# Patient Record
Sex: Male | Born: 1995 | Race: Black or African American | Hispanic: No | Marital: Single | Smoking: Current every day smoker
Health system: Southern US, Community
[De-identification: ages and names within clinical notes are randomized; demographics above are authoritative.]

---
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
## 2018-07-11 ENCOUNTER — Other Ambulatory Visit: Payer: Self-pay

## 2018-07-11 ENCOUNTER — Encounter: Payer: Self-pay | Admitting: Emergency Medicine

## 2018-07-11 ENCOUNTER — Emergency Department
Admission: EM | Admit: 2018-07-11 | Discharge: 2018-07-11 | Disposition: A | Discharge status: Home / Self Care | Payer: Self-pay | Attending: Emergency Medicine | Admitting: Emergency Medicine

## 2018-07-11 DIAGNOSIS — N3000 Acute cystitis without hematuria: Secondary | ICD-10-CM | POA: Insufficient documentation

## 2018-07-11 DIAGNOSIS — F1210 Cannabis abuse, uncomplicated: Secondary | ICD-10-CM | POA: Insufficient documentation

## 2018-07-11 DIAGNOSIS — R109 Unspecified abdominal pain: Secondary | ICD-10-CM | POA: Insufficient documentation

## 2018-07-11 DIAGNOSIS — R1012 Left upper quadrant pain: Secondary | ICD-10-CM | POA: Insufficient documentation

## 2018-07-11 LAB — COMPREHENSIVE METABOLIC PANEL
ALT: 60 U/L — AB (ref 0–44)
AST: 31 U/L (ref 15–41)
Albumin: 4.4 g/dL (ref 3.5–5.0)
Alkaline Phosphatase: 84 U/L (ref 38–126)
Anion gap: 6 (ref 5–15)
BILIRUBIN TOTAL: 0.6 mg/dL (ref 0.3–1.2)
BUN: 11 mg/dL (ref 6–20)
CO2: 26 mmol/L (ref 22–32)
CREATININE: 1 mg/dL (ref 0.61–1.24)
Calcium: 9.2 mg/dL (ref 8.9–10.3)
Chloride: 106 mmol/L (ref 98–111)
GFR calc Af Amer: >60 mL/min (ref >60)
GFR calc non Af Amer: >60 mL/min (ref >60)
GLUCOSE: 93 mg/dL (ref 70–99)
Potassium: 4.1 mmol/L (ref 3.5–5.1)
Sodium: 138 mmol/L (ref 135–145)
TOTAL PROTEIN: 7.3 g/dL (ref 6.5–8.1)

## 2018-07-11 LAB — CBC
HEMATOCRIT: 47.3 % (ref 39.0–52.0)
Hemoglobin: 16.2 g/dL (ref 13.0–17.0)
MCH: 31.8 pg (ref 26.0–34.0)
MCHC: 34.2 g/dL (ref 30.0–36.0)
MCV: 92.9 fL (ref 80.0–100.0)
Platelets: 218 10*3/uL (ref 150–400)
RBC: 5.09 MIL/uL (ref 4.22–5.81)
RDW: 12.7 % (ref 11.5–15.5)
WBC: 6.4 10*3/uL (ref 4.0–10.5)
nRBC: 0 % (ref 0.0–0.2)

## 2018-07-11 LAB — URINALYSIS, COMPLETE (UACMP) WITH MICROSCOPIC
BACTERIA UA: NONE SEEN
Bilirubin Urine: NEGATIVE
Glucose, UA: NEGATIVE mg/dL
HGB URINE DIPSTICK: NEGATIVE
Ketones, ur: NEGATIVE mg/dL
NITRITE: NEGATIVE
PROTEIN: NEGATIVE mg/dL
SPECIFIC GRAVITY, URINE: 1.026 (ref 1.005–1.030)
pH: 6 (ref 5.0–8.0)

## 2018-07-11 LAB — LIPASE, BLOOD: Lipase: 31 U/L (ref 11–51)

## 2018-07-11 MED ORDER — SULFAMETHOXAZOLE-TRIMETHOPRIM 800-160 MG PO TABS: 1.0000 | ORAL_TABLET | Freq: Two times a day (BID) | ORAL | 0 refills | Status: AC

## 2018-07-11 MED ORDER — ACETAMINOPHEN 500 MG PO TABS
1000.0000 mg | ORAL_TABLET | Freq: Once | ORAL | Status: AC
  Administered 2018-07-11: 1000 mg via ORAL
  Filled 2018-07-11: qty 2

## 2018-07-11 NOTE — Discharge Instructions (Addendum)
You may take Tylenol for your pain.  Please avoid Motrin or other NSAID medications as they can cause irritation of the lining of your stomach.  Please establish a primary care physician for follow-up.  Follow the instructions for high-fiber diet, and drink plenty of clear liquid.  Please decrease or stop your marijuana use to see if this improves your symptoms.  They had some white blood cells in your urine but no bacteria.  Please have your primary care physician follow-up the results of your urine culture.  Please take the entire course of antibiotics, even if you are feeling better.  Turn to the emergency department for severe pain, lightheadedness or fainting, fever, inability to keep down fluids, or any other symptoms concerning to you.

## 2018-07-11 NOTE — ED Triage Notes (Signed)
Pt states left sided abd pain that started about 4 months ago, denies N, V, D, appears in NAD.

## 2018-07-11 NOTE — ED Provider Notes (Addendum)
[facility] Emergency Department Provider Note  ____________________________________________  Time seen: Approximately 11:00 AM  I have reviewed the triage vital signs and the nursing notes.   HISTORY  Chief Complaint Abdominal Pain    HPI Alexander Page is a 22 y.o. male, otherwise healthy, frequent marijuana user, presenting with several months of left-sided abdominal pain.  The patient reports that for the last several months, he has a daily "vibrating" sensation in the left abdomen.  Every other day, he develops a dull ache in the left upper quadrant.  He does not have any associated nausea or vomiting, fevers or chills.  No dysuria or hematuria.  He denies any diarrhea or constipation and states he has a normal daily bowel movement without straining.  No blood in his stool.  He has not seen a primary care physician about his symptoms  FH: Family history of colon cancer.  History reviewed. No pertinent past medical history.  There are no active problems to display for this patient.   History reviewed. No pertinent surgical history.    Allergies Patient has no known allergies.  No family history on file.  Social History Social History   Tobacco Use  . Smoking status: Never Smoker  . Smokeless tobacco: Never Used  Substance Use Topics  . Alcohol use: Not Currently  . Drug use: Not on file    Review of Systems Constitutional: No fever/chills. Eyes: No visual changes. ENT: No sore throat. No congestion or rhinorrhea. Cardiovascular: Denies chest pain. Denies palpitations. Respiratory: Denies shortness of breath.  No cough. Gastrointestinal: Positive left-sided abdominal pain.  No nausea, no vomiting.  No diarrhea.  No constipation. Genitourinary: Negative for dysuria. Musculoskeletal: Negative for back pain. Skin: Negative for rash. Neurological: Negative for headaches. No focal numbness, tingling or weakness.      ____________________________________________   PHYSICAL EXAM:  VITAL SIGNS: ED Triage Vitals  Enc Vitals Group     BP 07/11/18 1020 (!) 161/83     Pulse Rate 07/11/18 1020 87     Resp 07/11/18 1020 18     Temp 07/11/18 1020 (!) 97.4 F (36.3 C)     Temp Source 07/11/18 1020 Oral     SpO2 07/11/18 1020 98 %     Weight 07/11/18 1024 189 lb 4.8 oz (85.9 kg)     Height 07/11/18 1021 5\' 6"  (1.676 m)     Head Circumference --      Peak Flow --      Pain Score 07/11/18 1020 6     Pain Loc --      Pain Edu? --      Excl. in [place]? --     Constitutional: Alert and oriented. Answers questions appropriately.  Patient is able to move around without any difficulty or pain. Eyes: Conjunctivae are normal.  EOMI. No scleral icterus. Head: Atraumatic. Nose: No congestion/rhinnorhea. Mouth/Throat: Mucous membranes are moist.  Neck: No stridor.  Supple.  No JVD.  No meningismus peer Cardiovascular: Normal rate, regular rhythm. No murmurs, rubs or gallops.  Respiratory: Normal respiratory effort.  No accessory muscle use or retractions. Lungs CTAB.  No wheezes, rales or ronchi. Gastrointestinal: Soft, and nondistended.  Nonfocal tenderness to palpation in the left abdomen, left upper quadrant greater than left lower quadrant.  No guarding or rebound.  No peritoneal signs. Musculoskeletal: No LE edema.  Neurologic:  A&Ox3.  Speech is clear.  Face and smile are symmetric.  EOMI.  Moves all extremities well. Skin:  Skin is warm, dry and intact. No rash noted. Psychiatric: Mood and affect are normal.   ____________________________________________   LABS (all labs ordered are listed, but only abnormal results are displayed)  Labs Reviewed  COMPREHENSIVE METABOLIC PANEL - Abnormal; Notable for the following components:      Result Value   ALT 60 (*)    All other components within normal limits  URINALYSIS, COMPLETE (UACMP) WITH MICROSCOPIC - Abnormal; Notable for the following components:    Color, Urine YELLOW (*)    APPearance CLEAR (*)    Leukocytes, UA TRACE (*)    All other components within normal limits  URINE CULTURE  LIPASE, BLOOD  CBC   ____________________________________________  EKG  Not indicated ____________________________________________  RADIOLOGY  No results found.  ____________________________________________   PROCEDURES  Procedure(s) performed: None  Procedures  Critical Care performed: No ____________________________________________   INITIAL IMPRESSION / ASSESSMENT AND PLAN / ED COURSE  Pertinent labs & imaging results that were available during my care of the patient were reviewed by me and considered in my medical decision making (see chart for details).  22 y.o. male, otherwise healthy, presenting with left-sided abdominal pain for the last several months.  The patient reports a history which is atypical with this vibrating sensation and intermittent pain.  My examination is reassuring.  He is mildly hypertensive at 161/83 and we will recheck his blood pressure.  The etiology of the patient's pain is unclear, but an acute infectious or surgical pathology is very unlikely.  Diverticulitis would be unlikely and I do not suspect an obstruction.  There is no clinical indication for imaging at this time but I will follow the patient's laboratory studies to make sure he has no significant abnormalities.  A urinalysis is pending.  I have encouraged the patient to keep a food diary, eat a high-fiber diet, drink plenty of clear liquid, and decrease or stop his frequent marijuana use.  He will establish a primary care physician at the [facility] clinic for reevaluation.  We discussed return precautions.  ----------------------------------------- 11:46 AM on 07/11/2018 -----------------------------------------  The patient's urinalysis shows no bacteria but 11-20 white blood cells with trace leukocyte esterase.  It is unlikely this is the cause  of his symptoms, but I will send a urine culture and give him a 3-day course of Bactrim to cover him for possible UTI.  ____________________________________________  FINAL CLINICAL IMPRESSION(S) / ED DIAGNOSES  Final diagnoses:  Left sided abdominal pain  Acute cystitis without hematuria         NEW MEDICATIONS STARTED DURING THIS VISIT:  New Prescriptions   SULFAMETHOXAZOLE-TRIMETHOPRIM (BACTRIM DS,SEPTRA DS) 800-160 MG TABLET    Take 1 tablet by mouth 2 (two) times daily for 3 days.      Rockne Menghini, MD 07/11/18 1107    Rockne Menghini, MD 07/11/18 1146

## 2018-07-13 LAB — URINE CULTURE: CULTURE: NO GROWTH

## 2019-08-21 ENCOUNTER — Encounter: Payer: Self-pay | Admitting: Emergency Medicine

## 2019-08-21 ENCOUNTER — Emergency Department: Payer: No Typology Code available for payment source

## 2019-08-21 ENCOUNTER — Other Ambulatory Visit: Payer: Self-pay

## 2019-08-21 ENCOUNTER — Emergency Department
Admission: EM | Admit: 2019-08-21 | Discharge: 2019-08-21 | Disposition: A | Discharge status: Home / Self Care | Payer: No Typology Code available for payment source | Attending: Emergency Medicine | Admitting: Emergency Medicine

## 2019-08-21 DIAGNOSIS — M25551 Pain in right hip: Secondary | ICD-10-CM | POA: Insufficient documentation

## 2019-08-21 DIAGNOSIS — M545 Low back pain: Secondary | ICD-10-CM | POA: Insufficient documentation

## 2019-08-21 DIAGNOSIS — F17200 Nicotine dependence, unspecified, uncomplicated: Secondary | ICD-10-CM | POA: Insufficient documentation

## 2019-08-21 DIAGNOSIS — M791 Myalgia, unspecified site: Secondary | ICD-10-CM | POA: Insufficient documentation

## 2019-08-21 DIAGNOSIS — R6884 Jaw pain: Secondary | ICD-10-CM | POA: Diagnosis present

## 2019-08-21 DIAGNOSIS — M7918 Myalgia, other site: Secondary | ICD-10-CM

## 2019-08-21 MED ORDER — CYCLOBENZAPRINE HCL 10 MG PO TABS: 10.0000 mg | ORAL_TABLET | Freq: Three times a day (TID) | ORAL | 0 refills | Status: AC | PRN

## 2019-08-21 MED ORDER — TRAMADOL HCL 50 MG PO TABS: 50.0000 mg | ORAL_TABLET | Freq: Two times a day (BID) | ORAL | 0 refills | Status: AC | PRN
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
## 2019-08-25 ENCOUNTER — Other Ambulatory Visit: Payer: Self-pay

## 2019-08-25 ENCOUNTER — Emergency Department
Admission: EM | Admit: 2019-08-25 | Discharge: 2019-08-25 | Disposition: A | Discharge status: Home / Self Care | Payer: Medicaid Other | Attending: Emergency Medicine | Admitting: Emergency Medicine

## 2019-08-25 DIAGNOSIS — M25551 Pain in right hip: Secondary | ICD-10-CM | POA: Insufficient documentation

## 2019-08-25 DIAGNOSIS — M545 Low back pain, unspecified: Secondary | ICD-10-CM

## 2019-08-25 DIAGNOSIS — F17200 Nicotine dependence, unspecified, uncomplicated: Secondary | ICD-10-CM | POA: Insufficient documentation

## 2019-08-25 MED ORDER — PREDNISONE 10 MG (21) PO TBPK: ORAL_TABLET | ORAL | 0 refills | Status: AC
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
## 2020-03-17 ENCOUNTER — Telehealth: Payer: Self-pay | Admitting: General Practice

## 2020-03-17 NOTE — Telephone Encounter (Signed)
Individual has been contacted 3+ times regarding ED referral and has been given information regarding the clinic. No further attempts to contact individual will be made. 
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
## 2022-07-19 ENCOUNTER — Other Ambulatory Visit: Payer: Self-pay

## 2022-07-19 ENCOUNTER — Emergency Department
Admission: EM | Admit: 2022-07-19 | Discharge: 2022-07-19 | Disposition: A | Discharge status: Home / Self Care | Payer: Medicaid Other | Attending: Emergency Medicine | Admitting: Emergency Medicine

## 2022-07-19 ENCOUNTER — Encounter: Payer: Self-pay | Admitting: Emergency Medicine

## 2022-07-19 DIAGNOSIS — L02411 Cutaneous abscess of right axilla: Secondary | ICD-10-CM | POA: Insufficient documentation

## 2022-07-19 DIAGNOSIS — L0291 Cutaneous abscess, unspecified: Secondary | ICD-10-CM

## 2022-07-19 MED ORDER — DOXYCYCLINE MONOHYDRATE 100 MG PO TABS: 100.0000 mg | ORAL_TABLET | Freq: Two times a day (BID) | ORAL | 0 refills | Status: AC

## 2022-07-19 MED ORDER — LIDOCAINE HCL 1 % IJ SOLN
5.0000 mL | Freq: Once | INTRAMUSCULAR | Status: AC
  Administered 2022-07-19: 5 mL
  Filled 2022-07-19: qty 10

## 2022-07-19 NOTE — Discharge Instructions (Addendum)
Take doxycycline twice daily for seven days. Remove packing in three days.

## 2022-07-19 NOTE — ED Triage Notes (Signed)
Patient ambulatory to triage with steady gait, without difficulty or distress noted; pt reports abscess to rt axillae x 4 days; denies hx of same

## 2022-07-19 NOTE — ED Notes (Signed)
E-signature pad unavailable - Pt verbalized understanding of D/C information - no additional concerns at this time.  

## 2022-07-20 NOTE — ED Provider Notes (Signed)
[facility] Provider Note  Patient Contact: 12:14 AM (approximate)   History   Abscess   HPI  Alexander Page is a 26 y.o. male presents to the emergency department with a 3 cm x 2 cm right axillary abscess this been present for the past several days.  No fever or chills.      Physical Exam   Triage Vital Signs: ED Triage Vitals  Enc Vitals Group     BP 07/19/22 1946 (!) 153/95     Pulse Rate 07/19/22 1946 78     Resp 07/19/22 1946 18     Temp 07/19/22 1946 98.5 F (36.9 C)     Temp Source 07/19/22 2323 Oral     SpO2 07/19/22 1946 97 %     Weight 07/19/22 1944 171 lb 11.8 oz (77.9 kg)     Height --      Head Circumference --      Peak Flow --      Pain Score 07/19/22 1942 7     Pain Loc --      Pain Edu? --      Excl. in [place]? --     Most recent vital signs: Vitals:   07/19/22 1946 07/19/22 2323  BP: (!) 153/95 133/89  Pulse: 78 77  Resp: 18 18  Temp: 98.5 F (36.9 C) 98.2 F (36.8 C)  SpO2: 97% 100%     General: Alert and in no acute distress. Eyes:  PERRL. EOMI. Head: No acute traumatic findings ENT:      Nose: No congestion/rhinnorhea.      Mouth/Throat: Mucous membranes are moist. Neck: No stridor. No cervical spine tenderness to palpation. Cardiovascular:  Good peripheral perfusion Respiratory: Normal respiratory effort without tachypnea or retractions. Lungs CTAB. Good air entry to the bases with no decreased or absent breath sounds. Gastrointestinal: Bowel sounds 4 quadrants. Soft and nontender to palpation. No guarding or rigidity. No palpable masses. No distention. No CVA tenderness. Musculoskeletal: Full range of motion to all extremities.  Neurologic:  No gross focal neurologic deficits are appreciated.  Skin: Patient has a 3 cm x 2 cm right axillary abscess. Other:   ED Results / Procedures / Treatments   Labs (all labs ordered are listed, but only abnormal results are displayed) Labs Reviewed - No data to  display    PROCEDURES:  Critical Care performed: No  ..Incision and Drainage  Date/Time: 07/20/2022 12:16 AM  Performed by: Orvil Feil, PA-C Authorized by: Orvil Feil, PA-C   Consent:    Consent obtained:  Verbal   Risks discussed:  Incomplete drainage   Alternatives discussed:  No treatment Universal protocol:    Procedure explained and questions answered to patient or proxy's satisfaction: yes     Patient identity confirmed:  Verbally with patient Location:    Type:  Abscess   Location:  Upper extremity Sedation:    Sedation type:  None Anesthesia:    Anesthesia method:  Local infiltration   Local anesthetic:  Lidocaine 1% w/o epi Procedure type:    Complexity:  Simple Procedure details:    Ultrasound guidance: no     Incision types:  Stab incision   Incision depth:  Dermal   Wound management:  Probed and deloculated   Drainage:  Purulent   Drainage amount:  Moderate   Packing materials:  1/4 in iodoform gauze Post-procedure details:    Procedure completion:  Tolerated well, no immediate complications    MEDICATIONS  ORDERED IN ED: Medications  lidocaine (XYLOCAINE) 1 % (with pres) injection 5 mL (5 mLs Infiltration Given by Other 07/19/22 2320)     IMPRESSION / MDM / ASSESSMENT AND PLAN / ED COURSE  I reviewed the triage vital signs and the nursing notes.                              Assessment and plan Abscess 26 year old male presents to the emergency department with a right-sided axillary abscess.  Vital signs are reassuring at triage.  On exam, patient was alert, active and nontoxic-appearing.  Patient underwent incision and drainage without complication.  He was advised to have packing removed in 3 days.  Discharged with doxycycline.  All patient questions were answered.      FINAL CLINICAL IMPRESSION(S) / ED DIAGNOSES   Final diagnoses:  Abscess     Rx / DC Orders   ED Discharge Orders          Ordered    doxycycline  (ADOXA) 100 MG tablet  2 times daily        07/19/22 2312             Note:  This document was prepared using Dragon voice recognition software and may include unintentional dictation errors.   Pia Mau Hitchcock, PA-C 07/20/22 0020    Georga Hacking, MD 07/20/22 2215
